# Patient Record
Sex: Female | Born: 1970 | Race: White | Hispanic: No | Smoking: Never smoker
Health system: Southern US, Community
[De-identification: ages and names within clinical notes are randomized; demographics above are authoritative.]

## PROBLEM LIST (undated history)

## (undated) DIAGNOSIS — D229 Melanocytic nevi, unspecified: Secondary | ICD-10-CM

---
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
## 1996-11-13 DIAGNOSIS — D229 Melanocytic nevi, unspecified: Secondary | ICD-10-CM

## 1996-11-13 HISTORY — DX: Melanocytic nevi, unspecified: D22.9

## 1997-11-28 ENCOUNTER — Other Ambulatory Visit: Admission: RE | Admit: 1997-11-28 | Discharge: 1997-11-28 | Payer: Self-pay | Admitting: Obstetrics and Gynecology

## 1998-08-11 ENCOUNTER — Ambulatory Visit (HOSPITAL_COMMUNITY): Admission: RE | Admit: 1998-08-11 | Discharge: 1998-08-11 | Payer: Self-pay | Admitting: Obstetrics and Gynecology

## 1998-12-17 ENCOUNTER — Other Ambulatory Visit: Admission: RE | Admit: 1998-12-17 | Discharge: 1998-12-17 | Payer: Self-pay | Admitting: Obstetrics and Gynecology

## 1999-11-22 ENCOUNTER — Inpatient Hospital Stay (HOSPITAL_COMMUNITY): Admission: AD | Admit: 1999-11-22 | Discharge: 1999-11-24 | Payer: Self-pay | Admitting: Obstetrics and Gynecology

## 2000-01-05 ENCOUNTER — Other Ambulatory Visit: Admission: RE | Admit: 2000-01-05 | Discharge: 2000-01-05 | Payer: Self-pay | Admitting: Obstetrics and Gynecology

## 2001-01-27 ENCOUNTER — Other Ambulatory Visit: Admission: RE | Admit: 2001-01-27 | Discharge: 2001-01-27 | Payer: Self-pay | Admitting: Obstetrics and Gynecology

## 2001-10-10 ENCOUNTER — Inpatient Hospital Stay (HOSPITAL_COMMUNITY): Admission: AD | Admit: 2001-10-10 | Discharge: 2001-10-13 | Payer: Self-pay | Admitting: Obstetrics and Gynecology

## 2001-10-14 ENCOUNTER — Encounter: Admission: RE | Admit: 2001-10-14 | Discharge: 2001-11-13 | Payer: Self-pay | Admitting: Obstetrics and Gynecology

## 2001-11-21 ENCOUNTER — Other Ambulatory Visit: Admission: RE | Admit: 2001-11-21 | Discharge: 2001-11-21 | Payer: Self-pay | Admitting: Obstetrics and Gynecology

## 2003-01-08 ENCOUNTER — Other Ambulatory Visit: Admission: RE | Admit: 2003-01-08 | Discharge: 2003-01-08 | Payer: Self-pay | Admitting: Obstetrics and Gynecology

## 2004-01-10 ENCOUNTER — Other Ambulatory Visit: Admission: RE | Admit: 2004-01-10 | Discharge: 2004-01-10 | Payer: Self-pay | Admitting: Obstetrics and Gynecology

## 2005-04-26 ENCOUNTER — Other Ambulatory Visit: Admission: RE | Admit: 2005-04-26 | Discharge: 2005-04-26 | Payer: Self-pay | Admitting: Obstetrics and Gynecology

## 2006-06-03 ENCOUNTER — Encounter: Admission: RE | Admit: 2006-06-03 | Discharge: 2006-06-03 | Payer: Self-pay | Admitting: Obstetrics and Gynecology

## 2007-07-15 IMAGING — MG MM DIAGNOSTIC BILATERAL
4 series · 4 of 4 positions shown · non-contrast
Comparison: 04/26/05 at [REDACTED].

DG DIAGNOSTIC BILATERAL
Bilateral CC and MLO view(s) were taken.

DIGITAL BILATERAL DIAGNOSTIC MAMMOGRAM WITH CAD:
CLINICAL DATA: Tenderness throughout the lower, outer aspects of the left breast for the past 
year.

[R CC]
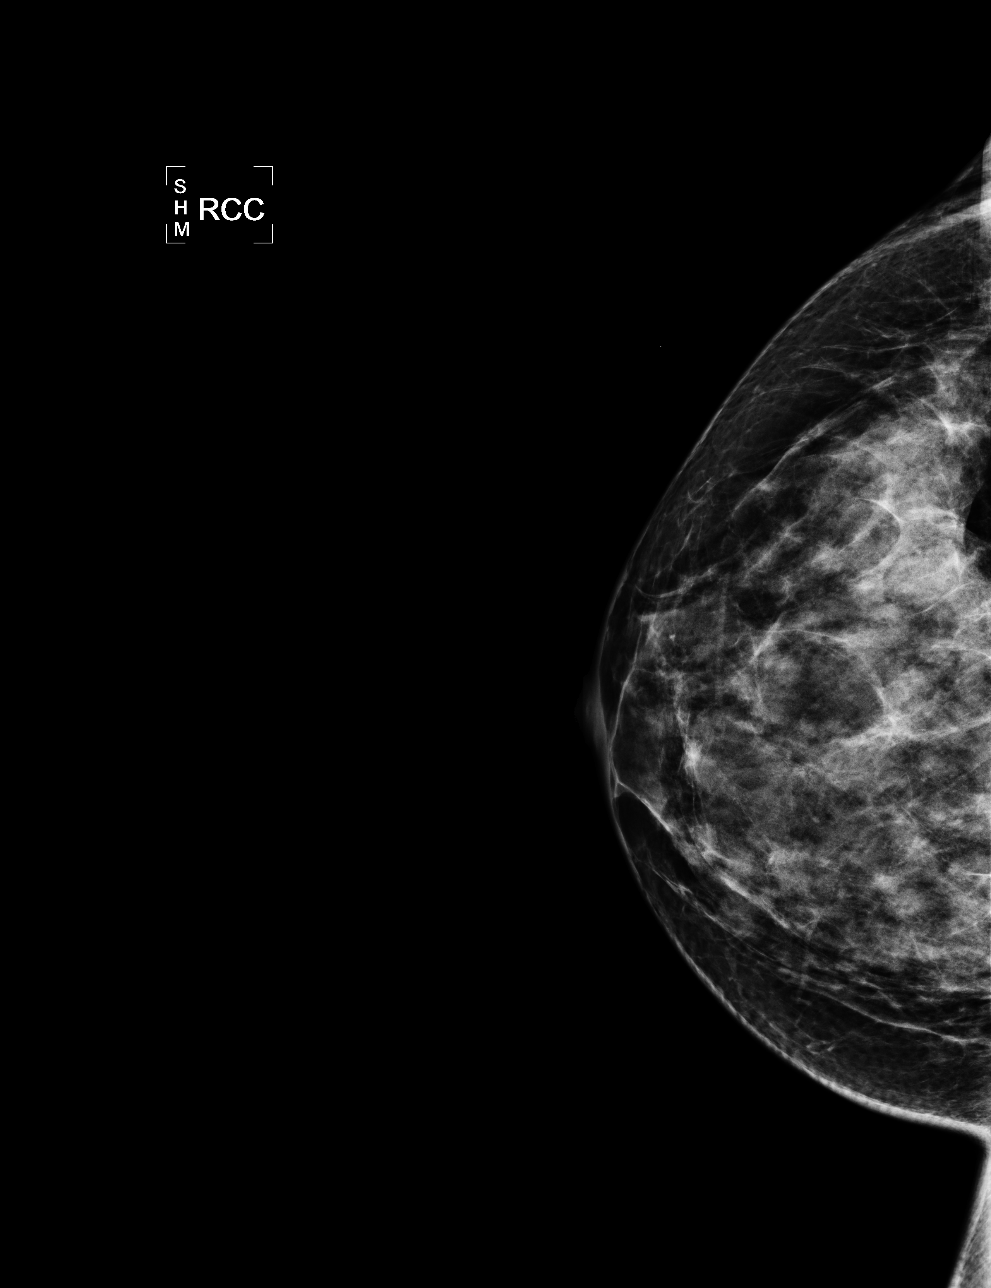

[L CC]
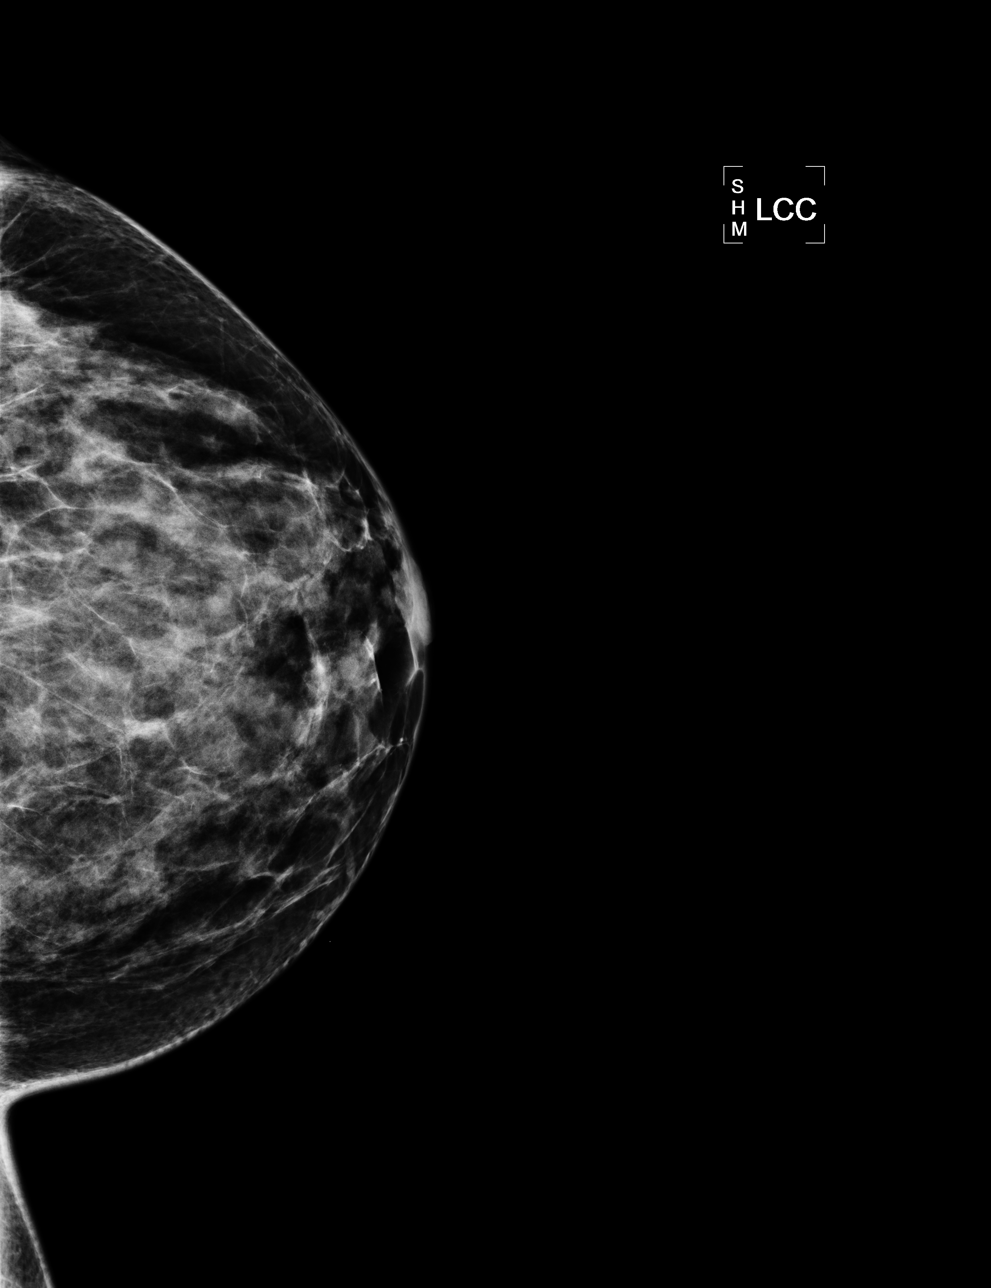

[L MLO]
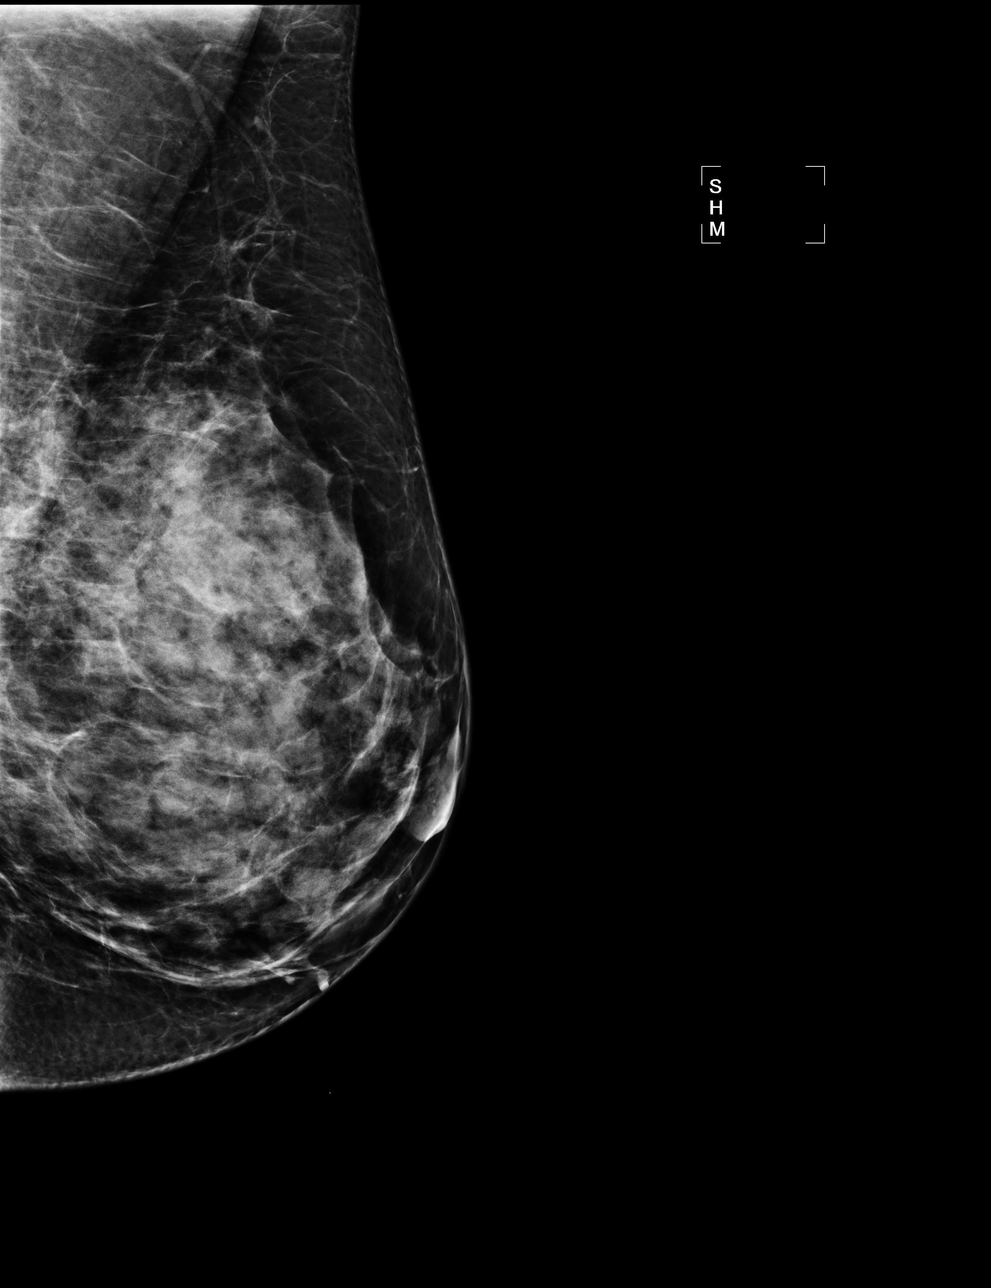

[R MLO]
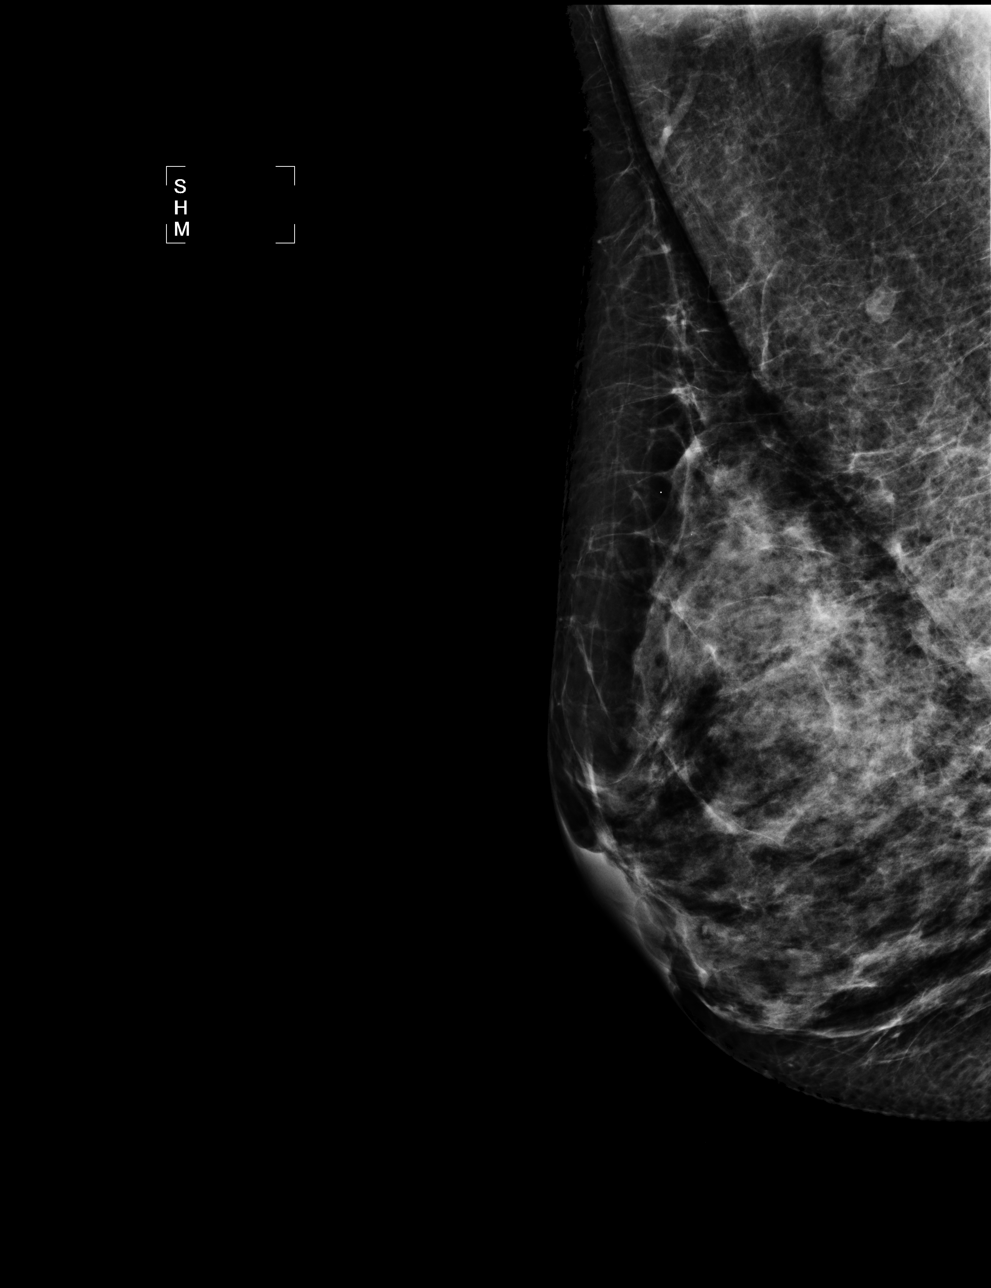

[4 of 4 positions shown; findings below may reference images not displayed]

Heterogeneously dense parenchyma in each breast is unchanged with no findings suspicious for 
malignancy.
IMPRESSION: No evidence of malignancy.  Screening mammography is recommended at age 38.

ASSESSMENT: Negative - BI-RADS 1

Screening mammogram at age 38.
ANALYZED BY COMPUTER AIDED DETECTION. , THIS PROCEDURE WAS A DIGITAL MAMMOGRAM

## 2009-11-20 ENCOUNTER — Ambulatory Visit (HOSPITAL_COMMUNITY): Admission: RE | Admit: 2009-11-20 | Discharge: 2009-11-20 | Payer: Self-pay | Admitting: Obstetrics and Gynecology

## 2010-08-02 ENCOUNTER — Encounter: Payer: Self-pay | Admitting: Obstetrics and Gynecology

## 2010-09-29 LAB — URINALYSIS, ROUTINE W REFLEX MICROSCOPIC
Ketones, ur: NEGATIVE mg/dL
Nitrite: NEGATIVE
Protein, ur: NEGATIVE mg/dL
Urobilinogen, UA: 0.2 mg/dL (ref 0.0–1.0)

## 2010-09-29 LAB — CBC
HCT: 40.5 % (ref 36.0–46.0)
RBC: 4.44 MIL/uL (ref 3.87–5.11)
WBC: 9 10*3/uL (ref 4.0–10.5)

## 2010-09-29 LAB — URINE MICROSCOPIC-ADD ON

## 2011-12-28 ENCOUNTER — Other Ambulatory Visit: Payer: Self-pay | Admitting: Physician Assistant

## 2011-12-28 MED ORDER — DOXYCYCLINE HYCLATE 100 MG PO TABS: 100.0000 mg | ORAL_TABLET | Freq: Two times a day (BID) | ORAL | Status: AC

## 2011-12-28 NOTE — Progress Notes (Signed)
Amy Vance reports that she noted a small bump in her right axilla on Sunday and it has increased in size slightly and is red. It remains tender. No fever or chills.    Right axilla with a pea sized tender red nodule.  Does not appear to be a node.  No drainage  ?early abscess Doxy 100 bid 10 days Hot compresses Status update 2 days

## 2012-01-31 ENCOUNTER — Other Ambulatory Visit: Payer: Self-pay | Admitting: Emergency Medicine

## 2012-02-01 NOTE — Telephone Encounter (Signed)
Need chart to nurses station. 

## 2012-02-02 ENCOUNTER — Other Ambulatory Visit: Payer: Self-pay | Admitting: Physician Assistant

## 2012-02-02 MED ORDER — ESCITALOPRAM OXALATE 10 MG PO TABS: 10.0000 mg | ORAL_TABLET | Freq: Every day | ORAL | Status: DC

## 2012-02-02 NOTE — Telephone Encounter (Signed)
Amy Vance calls requesting refills.  She states she was traveling so much back and forth to [facility] and was forgetting to take meds consistently so she just stopped for a month.  When she went to get a refill they had expired.  She feels like she needs to restart and there are new pressures at work and feels that she is getting depressed again.  She is doing well otherwise.  No HA, weight changes, n/v, anorexia, myalgias.  I will refill meds for 1 year as she notes that her traveling will continue.  She will recheck by phone and come in if she is not doing well.

## 2019-05-04 ENCOUNTER — Other Ambulatory Visit: Payer: Self-pay

## 2019-05-04 DIAGNOSIS — Z20822 Contact with and (suspected) exposure to covid-19: Secondary | ICD-10-CM

## 2019-05-06 LAB — NOVEL CORONAVIRUS, NAA: SARS-CoV-2, NAA: NOT DETECTED

## 2020-12-01 ENCOUNTER — Other Ambulatory Visit: Payer: Self-pay

## 2020-12-01 ENCOUNTER — Encounter: Payer: Self-pay | Admitting: Dermatology

## 2020-12-01 ENCOUNTER — Ambulatory Visit: Payer: BC Managed Care – PPO | Admitting: Dermatology

## 2020-12-01 DIAGNOSIS — Z87898 Personal history of other specified conditions: Secondary | ICD-10-CM

## 2020-12-01 DIAGNOSIS — Z1283 Encounter for screening for malignant neoplasm of skin: Secondary | ICD-10-CM

## 2020-12-01 DIAGNOSIS — D1801 Hemangioma of skin and subcutaneous tissue: Secondary | ICD-10-CM

## 2020-12-01 DIAGNOSIS — L905 Scar conditions and fibrosis of skin: Secondary | ICD-10-CM

## 2020-12-01 DIAGNOSIS — Z86018 Personal history of other benign neoplasm: Secondary | ICD-10-CM | POA: Diagnosis not present

## 2020-12-01 DIAGNOSIS — D2371 Other benign neoplasm of skin of right lower limb, including hip: Secondary | ICD-10-CM

## 2020-12-01 DIAGNOSIS — D239 Other benign neoplasm of skin, unspecified: Secondary | ICD-10-CM

## 2020-12-01 DIAGNOSIS — L821 Other seborrheic keratosis: Secondary | ICD-10-CM

## 2020-12-13 ENCOUNTER — Encounter: Payer: Self-pay | Admitting: Dermatology

## 2020-12-13 NOTE — Progress Notes (Signed)
   Follow-Up Visit   Subjective  Amy Vance is a 50 y.o. female who presents for the following: Annual Exam (Here for annual skin exam. Concerns are patients back. Her daughter circled them. H/O of atypical moles per paper chart).  Annual skin examination, check possible new spots on back Location:  Duration:  Quality:  Associated Signs/Symptoms: Modifying Factors:  Severity:  Timing: Context:   Objective  Well appearing patient in no apparent distress; mood and affect are within normal limits. Objective  Left Inguinal Area: No atypical moles, skin cancer or MM at today's visit.  Objective  Right Lower Leg - Anterior: 5 mm firm pink dermal papule  Objective  Chest - [facility]): All scars clear.  No repigmentation.  Objective  Right Lower Back: Multiple light brown flattopped textured papules    All sun exposed areas plus back examined.   Assessment & Plan    Screening exam for skin cancer Left Inguinal Area  Keep yearly skin exam  Dermatofibroma Right Lower Leg - Anterior  Stable defer any treatment  History of atypical nevus Chest - [facility])  Recheck as needed.  Cherry angioma Left Shoulder - Anterior  Seborrheic keratosis Right Lower Back  Leave if stable      I, Amy Monarch, MD, have reviewed all documentation for this visit.  The documentation on 12/13/20 for the exam, diagnosis, procedures, and orders are all accurate and complete.
